# Patient Record
Sex: Male | Born: 1990 | Race: Black or African American | Hispanic: No | Smoking: Never smoker
Health system: Southern US, Community
[De-identification: ages and names within clinical notes are randomized; demographics above are authoritative.]

---

## 2014-05-23 ENCOUNTER — Emergency Department (HOSPITAL_COMMUNITY)
Admission: EM | Admit: 2014-05-23 | Discharge: 2014-05-23 | Disposition: A | Discharge status: Home / Self Care | Payer: Self-pay | Attending: Emergency Medicine | Admitting: Emergency Medicine

## 2014-05-23 ENCOUNTER — Encounter (HOSPITAL_COMMUNITY): Payer: Self-pay | Admitting: Emergency Medicine

## 2014-05-23 ENCOUNTER — Emergency Department (HOSPITAL_COMMUNITY): Payer: Self-pay

## 2014-05-23 DIAGNOSIS — R110 Nausea: Secondary | ICD-10-CM | POA: Insufficient documentation

## 2014-05-23 NOTE — ED Notes (Signed)
Bed: [facility] Expected date:  Expected time:  Means of arrival:  Comments: EMS/23yoM/n/v after swallowing pennies

## 2014-05-23 NOTE — Discharge Instructions (Signed)
Return here for any problems °

## 2014-05-23 NOTE — ED Notes (Signed)
Per EMS: pt swallowed two pennies, pt groggy but oriented x 4, pt c/o dizziness and nausea, vomited once. 4 mg zofran 20 g left hand.

## 2014-05-23 NOTE — ED Provider Notes (Signed)
CSN: [id number]     Arrival date & time 05/23/14  1610 History   First MD Initiated Contact with Patient 05/23/14 1802     Chief Complaint  Patient presents with  . Swallowed Foreign Body     (Consider location/radiation/quality/duration/timing/severity/associated sxs/prior Treatment) HPI Comments: Patient presents after possibly swallowing 2 coins. Is very vague as to how this might have happened. Denies any illicit substance use at this time. Did have some nausea and had emesis times one. EMS was called and patient given IV fluids and Zofran. He is currently asymptomatic. Denies any abdominal pain. Denies any psychiatric illness.  Patient is a 23 y.o. male presenting with foreign body swallowed. The history is provided by the patient.  Swallowed Foreign Body    History reviewed. No pertinent past medical history. History reviewed. No pertinent past surgical history. No family history on file. History  Substance Use Topics  . Smoking status: Never Smoker   . Smokeless tobacco: Not on file  . Alcohol Use: Not on file    Review of Systems  All other systems reviewed and are negative.     Allergies  Review of patient's allergies indicates no known allergies.  Home Medications   Prior to Admission medications   Not on File   BP 98/56  Pulse 63  Temp(Src) 97.9 F (36.6 C) (Oral)  Resp 18  SpO2 97% Physical Exam  Nursing note and vitals reviewed. Constitutional: He is oriented to person, place, and time. He appears well-developed and well-nourished.  Non-toxic appearance. No distress.  HENT:  Head: Normocephalic and atraumatic.  Eyes: Conjunctivae, EOM and lids are normal. Pupils are equal, round, and reactive to light.  Neck: Normal range of motion. Neck supple. No tracheal deviation present. No mass present.  Cardiovascular: Normal rate, regular rhythm and normal heart sounds.  Exam reveals no gallop.   No murmur heard. Pulmonary/Chest: Effort normal and breath  sounds normal. No stridor. No respiratory distress. He has no decreased breath sounds. He has no wheezes. He has no rhonchi. He has no rales.  Abdominal: Soft. Normal appearance and bowel sounds are normal. He exhibits no distension. There is no tenderness. There is no rebound and no CVA tenderness.  Musculoskeletal: Normal range of motion. He exhibits no edema and no tenderness.  Neurological: He is alert and oriented to person, place, and time. He has normal strength. No cranial nerve deficit or sensory deficit. GCS eye subscore is 4. GCS verbal subscore is 5. GCS motor subscore is 6.  Skin: Skin is warm and dry. No abrasion and no rash noted.  Psychiatric: He has a normal mood and affect. His speech is normal and behavior is normal.    ED Course  Procedures (including critical care time) Labs Review Labs Reviewed - No data to display  Imaging Review Dg Abd Acute W/chest  05/23/2014   CLINICAL DATA:  Coin foreign body ingestion  EXAM: ACUTE ABDOMEN SERIES (ABDOMEN 2 VIEW & CHEST 1 VIEW)  COMPARISON:  None.  FINDINGS: The lungs are well-expanded and clear. The heart and pulmonary vascularity are normal. There are no foreign bodies along the expected course of the esophagus.  Within the abdomen no radiopaque foreign bodies are demonstrated. The bowel gas pattern is normal. The bony structures of the chest and abdomen and pelvis are unremarkable.  IMPRESSION: No radiopaque foreign body is demonstrated. No acute cardiopulmonary nor acute intra-abdominal abnormality is demonstrated.   Electronically Signed   By: David  [email or web address]   On:  05/23/2014 17:05     EKG Interpretation None      MDM   Final diagnoses:  Nausea    Patient's acute abdominal series negative for foreign body. His abdominal exam is benign. He was given a meal here he is stable for discharge   Toy Baker, MD 05/23/14 Rickey Primus

## 2015-06-03 IMAGING — CR DG ABDOMEN ACUTE W/ 1V CHEST
4 series · 4 of 4 positions shown · non-contrast
Comparison: None.

CLINICAL DATA: Coin foreign body ingestion

EXAM:
ACUTE ABDOMEN SERIES (ABDOMEN 2 VIEW & CHEST 1 VIEW)

[w chest pa]
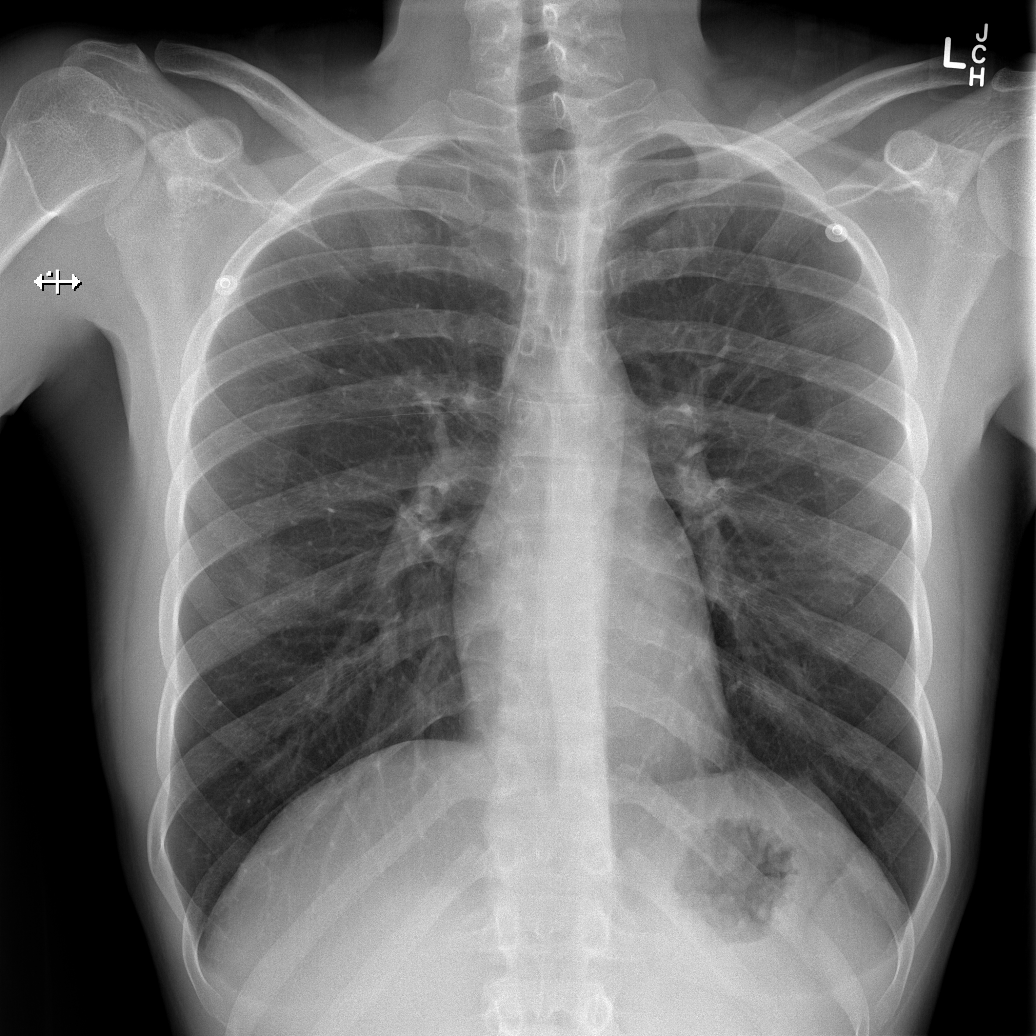

[w abdomen upright]
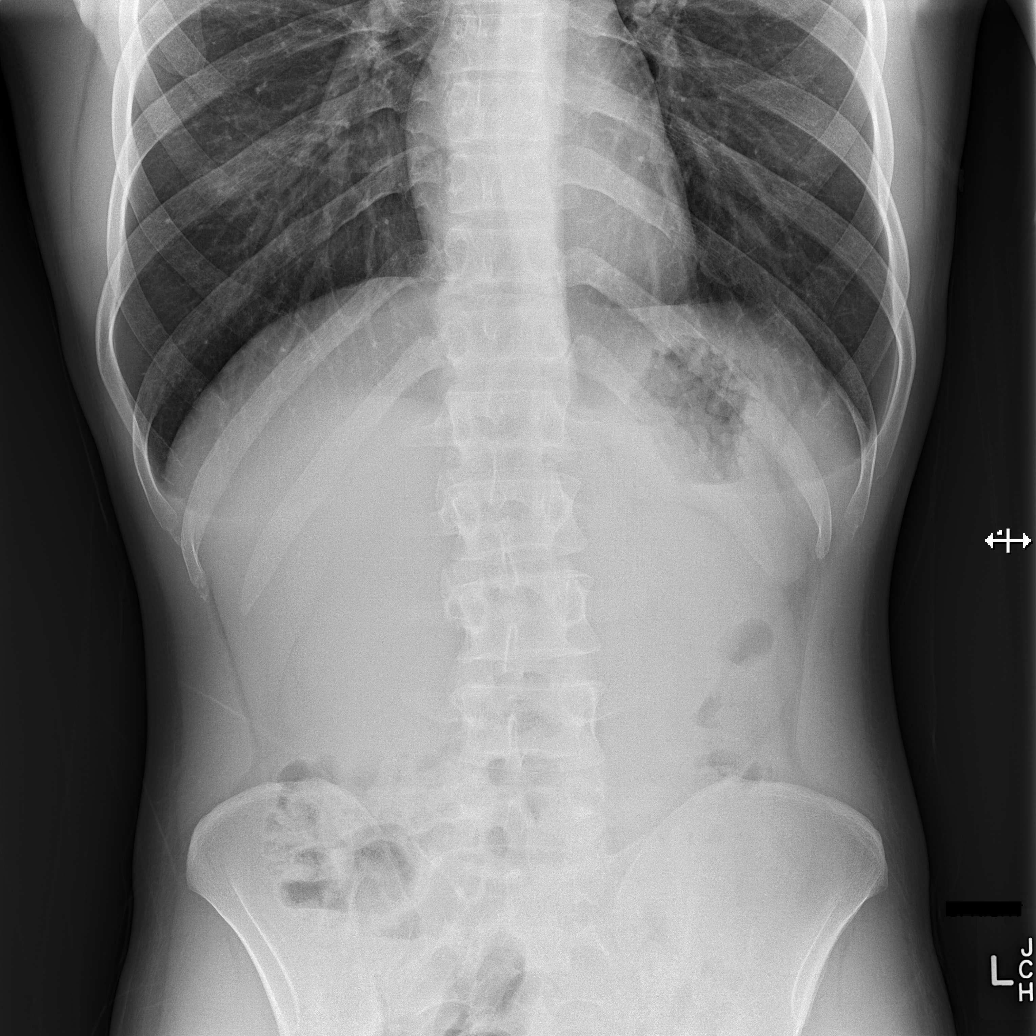

[t abdomen supine (1 of 2)]
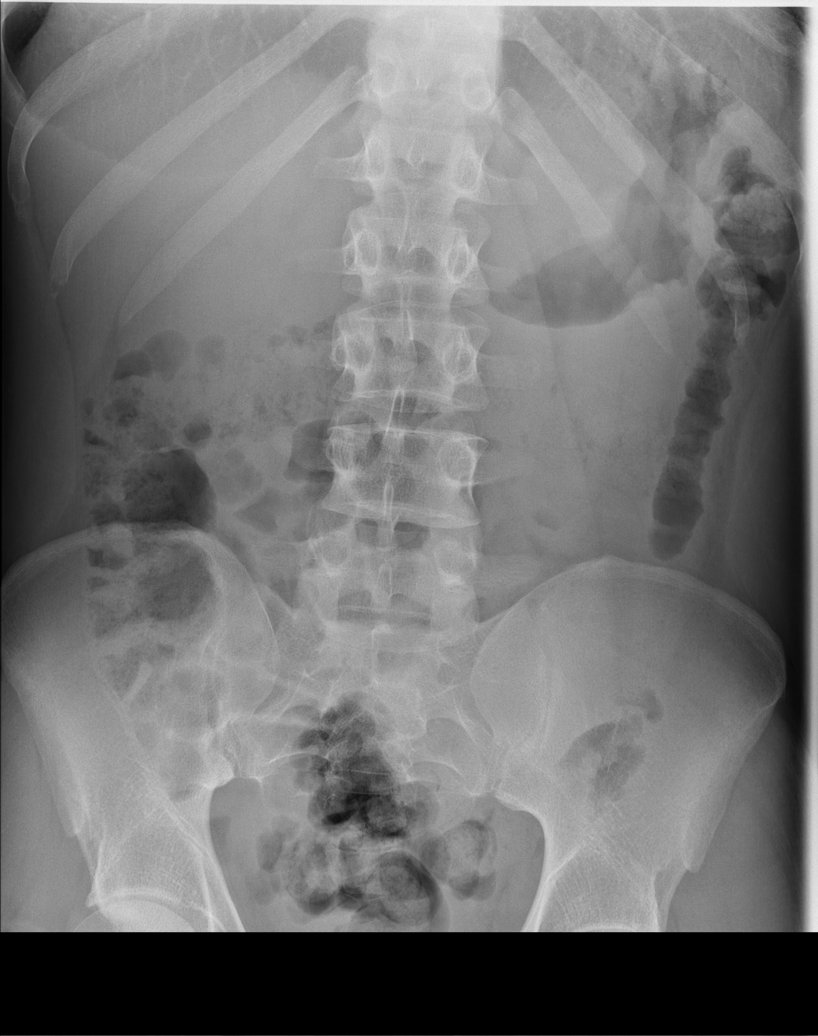

[t abdomen supine (2 of 2)]
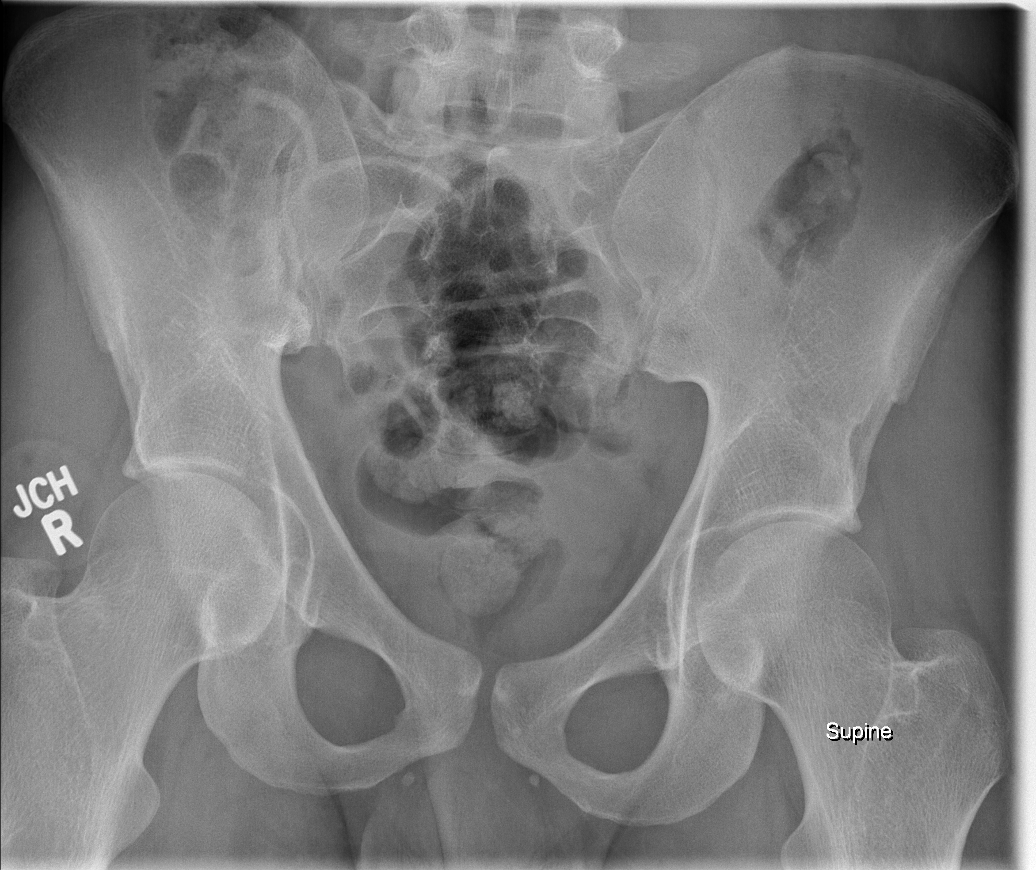

[4 of 4 positions shown; findings below may reference images not displayed]

FINDINGS: The lungs are well-expanded and clear. The heart and pulmonary
vascularity are normal. There are no foreign bodies along the
expected course of the esophagus.

Within the abdomen no radiopaque foreign bodies are demonstrated.
The bowel gas pattern is normal. The bony structures of the chest
and abdomen and pelvis are unremarkable.
IMPRESSION: No radiopaque foreign body is demonstrated. No acute cardiopulmonary
nor acute intra-abdominal abnormality is demonstrated.
